# Patient Record
Sex: Female | Born: 1965
Health system: Southern US, Community
[De-identification: ages and names within clinical notes are randomized; demographics above are authoritative.]

## PROBLEM LIST (undated history)

## (undated) DIAGNOSIS — T7840XA Allergy, unspecified, initial encounter: Secondary | ICD-10-CM

## (undated) HISTORY — PX: ABDOMINAL HYSTERECTOMY: SHX81

## (undated) HISTORY — DX: Allergy, unspecified, initial encounter: T78.40XA

---

## 2000-05-01 ENCOUNTER — Other Ambulatory Visit: Admission: RE | Admit: 2000-05-01 | Discharge: 2000-05-01 | Payer: Self-pay | Admitting: *Deleted

## 2004-04-07 ENCOUNTER — Other Ambulatory Visit: Admission: RE | Admit: 2004-04-07 | Discharge: 2004-04-07 | Payer: Self-pay | Admitting: Obstetrics and Gynecology

## 2007-01-09 ENCOUNTER — Ambulatory Visit (HOSPITAL_COMMUNITY): Admission: RE | Admit: 2007-01-09 | Discharge: 2007-01-09 | Payer: Self-pay | Admitting: Obstetrics and Gynecology

## 2007-01-10 ENCOUNTER — Ambulatory Visit: Admission: RE | Admit: 2007-01-10 | Discharge: 2007-01-10 | Payer: Self-pay | Admitting: Gynecology

## 2007-02-04 ENCOUNTER — Inpatient Hospital Stay (HOSPITAL_COMMUNITY): Admission: RE | Admit: 2007-02-04 | Discharge: 2007-02-06 | Payer: Self-pay | Admitting: Obstetrics and Gynecology

## 2007-02-04 ENCOUNTER — Encounter: Payer: Self-pay | Admitting: Gynecologic Oncology

## 2008-08-04 ENCOUNTER — Encounter: Payer: Self-pay | Admitting: Orthopedic Surgery

## 2008-08-04 ENCOUNTER — Ambulatory Visit (HOSPITAL_COMMUNITY): Admission: RE | Admit: 2008-08-04 | Discharge: 2008-08-04 | Payer: Self-pay | Admitting: Pediatrics

## 2008-09-13 ENCOUNTER — Ambulatory Visit: Payer: Self-pay | Admitting: Orthopedic Surgery

## 2008-09-13 DIAGNOSIS — M24873 Other specific joint derangements of unspecified ankle, not elsewhere classified: Secondary | ICD-10-CM

## 2008-09-13 DIAGNOSIS — M24876 Other specific joint derangements of unspecified foot, not elsewhere classified: Secondary | ICD-10-CM

## 2008-09-14 ENCOUNTER — Encounter: Payer: Self-pay | Admitting: Orthopedic Surgery

## 2008-09-16 ENCOUNTER — Encounter (HOSPITAL_COMMUNITY): Admission: RE | Admit: 2008-09-16 | Discharge: 2008-09-21 | Payer: Self-pay | Admitting: Orthopedic Surgery

## 2008-09-16 ENCOUNTER — Encounter: Payer: Self-pay | Admitting: Orthopedic Surgery

## 2008-11-03 ENCOUNTER — Encounter: Admission: RE | Admit: 2008-11-03 | Discharge: 2008-11-03 | Payer: Self-pay | Admitting: Obstetrics and Gynecology

## 2010-08-15 NOTE — Consult Note (Signed)
[name], [name]             ACCOUNT NO.:  [id number]   MEDICAL RECORD NO.:  [id number]          PATIENT TYPE:  OUT   LOCATION:  GYN                          FACILITY:  [facility]   PHYSICIAN:  De Blanch, M.D.DATE OF BIRTH:  11/16/1965   DATE OF CONSULTATION:  DATE OF DISCHARGE:                                 CONSULTATION   CHIEF COMPLAINT:  Complex pelvic mass.   HISTORY OF PRESENT ILLNESS:  A 45 year old, white, married female  gravida 2, seen in consultation at the request of Dr. Carrington Mcbride  regarding management of a newly diagnosed left adnexal mass.  The  patient discovered the mass herself as she could feel it in her left  lower quadrant.  This has been further evaluated with an ultrasound and  a CT scan of the pelvis.  The CT scan shows a large complex mass in the  left adnexa measuring 8.3 x 7.5 x 6.6-cm.  Mass contains at least one  and potentially a second small septation showing minimal thickening in  the wall.  There are also some tiny calcifications in the inferior wall.  There is a small amount of free fluid.  The remainder of the scan looks  normal with no evidence of ascites, lymphadenopathy or any other omental  or peritoneal thickening.  She does have a 2.8 x 2.3-cm hepatic  hemangioma and a 6-mm lesion in the left lung which is nonspecific.  She  also has some small uterine fibroids.  CA-125 value was also obtained  which was 220 units per ml.   The patient has no significant gynecologic history.  She does note she  has dysmenorrhea.  Menstrual periods are regular and of approximately 2  to 3 days' duration.  She is not using any hormone replacement or oral  contraceptives.   OBSTETRICAL HISTORY:  Gravida 2.  She has 58 year old child and a 58-  year-old child.   PAST MEDICAL HISTORY:   MEDICAL ILLNESSES:  None.   PAST SURGICAL HISTORY:  None.   DRUG ALLERGIES:  None.  The patient does have a milk/protein allergy  causing diarrhea.   CURRENT MEDICATIONS:  Imodium p.r.n., Excedrin p.r.n.   FAMILY HISTORY:  Negative for gynecologic, breast, or colon cancer.  She  has a grandmother with brain cancer.   SOCIAL HISTORY:  She is married, nonsmoker, and a housewife.  Her  husband is a Ship broker at [employer].   REVIEW OF SYSTEMS:  A 10-point comprehensive review of systems is  otherwise negative except for occasional diarrhea which she associates  with her milk allergy.   PHYSICAL EXAMINATION:  VITAL SIGNS:  Weight 125 pounds, blood pressure  110/70, pulse 80, respiratory rate 20.  GENERAL:  The patient is a healthy white female in no acute distress.  HEENT:  Negative.  NECK:  Supple without thyromegaly.  LYMPH:  There is no supraclavicular or inguinal adenopathy.  ABDOMEN:  Soft, nontender.  There is a palpable mass in the left lower  quadrant which is relatively nontender.  It measures approximately 6-cm  on palpation.  PELVIC:  EG/BUS, vagina, bladder, urethra are normal.  The cervix is  parous and normal.  Uterus is anterior, slightly irregular, and is  contiguous with a mass in the left adnexa measuring approximately 7-cm  in diameter.  There is a moderate amount of mobility.  There is no cul-  de-sac nodularity or thickening of fixation.   IMPRESSION:  A complex pelvic mass with an elevated CA-125.   Given the patient's constellation of historical findings and imaging  studies, I suspect she has an endometrioma.  On the other hand, ovarian  cancer cannot be fully excluded.   I had a lengthy discussion with the patient and her friend regarding  management options.  I would recommend that the patient undergo  exploratory laparotomy through a Pfannenstiel incision with a left  salpingo-oophorectomy, evaluated by frozen section.  I indicated that if  this is a benign condition, that the patient would need to decide  preoperatively whether she wanted to have a  hysterectomy and/or a right  oophorectomy.  She understands that she would be menopausal if she had  the other ovary removed.  On the other hand, she understands that  leaving the ovary intact would preserve ovarian function for  approximately another 10 years.  The risks of ovarian cancer were  discussed, although they are not excessively high in this patient who  has no family history.   The extent of surgery if this turns out to be a cancer was discussed  including TAH-BSO, omentectomy, pelvic and periaortic lymphadenectomy  and surgical staging and/or debulking.  She understands that a second  midline incision may be necessary to accomplish all of these staging  procedures.   The risks of surgery were discussed.  We will coordinate surgery with  Dr. Henderson Cloud and anticipate November 4th as our next available operating  day.      De Blanch, M.D.  Electronically Signed     DC/MEDQ  D:  01/10/2007  T:  01/10/2007  Job:  [id number]   cc:   [name], M.D.  Fax: [phone]   Telford Nab, R.N.  [address]  [place], [place] [address]

## 2010-08-15 NOTE — Op Note (Signed)
NAMENUBIA, ZIESMER             ACCOUNT NO.:  [id number]   MEDICAL RECORD NO.:  [id number]          PATIENT TYPE:  INP   LOCATION:  1524                         FACILITY:  [facility]   PHYSICIAN:  Paola A. Duard Brady, MD    DATE OF BIRTH:  11-Aug-1965   DATE OF PROCEDURE:  02/04/2007  DATE OF DISCHARGE:                               OPERATIVE REPORT   PREOPERATIVE DIAGNOSIS:  Left-sided adnexal mass, elevated CA-125 of  220.   POSTOPERATIVE DIAGNOSIS:  Endometriosis  with left-sided endometrioma.   PROCEDURE:  Total abdominal hysterectomy and bilateral salpingo-  oophorectomy.   SURGEON:  Cleda Mccreedy, M.D. and Carrington Clamp, M.D.   ASSISTANT:  Telford Nab, R.N.   ANESTHESIA:  General.   ANESTHESIOLOGIST:  Dr. Coralyn Mark.   ESTIMATED BLOOD LOSS:  Less than 100 ml.   IV FLUIDS:  2000 ml.   URINE OUTPUT:  100 ml.   SPECIMENS:  Cervix, uterus, bilateral tubes and ovaries.   COMPLICATIONS:  None.   OPERATIVE FINDINGS:  Included an 8 cm left side endometrioma.  She had  endometriotic implants on the anterior vesicovaginal peritoneum, the  posterior cul-de-sac and the contralateral right ovary.  Frozen section  was consistent with a benign endometrioma.   The patient was taken to the operating room after consent was reviewed  and signed on the chart.  The patient was identified as self.  General  anesthesia was induced.  She was then placed in a dorsal lithotomy  position with all appropriate precautions.  The abdomen was then prepped  in the usual fashion.  The perineum was prepped in the usual sterile  fashion as was the vagina.  Foley catheter was inserted into the bladder  under sterile conditions.  The patient was then draped.  Time-out was  performed to confirm the surgeon, the patient, allergies and procedures.  The transverse  Pfannenstiel skin incision was made with a knife and  carried down to the underlying fascia with Bovie cautery.  The fascia  was identified  and was incised.  The fascial incisions were extended  laterally.  Peritoneum was identified, tented and entered sharply.  The  peritoneal incision was extended superiorly and inferiorly with  visualization of the underlying  peritoneal cavity.  The Bookwalter self-  retaining retractor was then placed on the bed.  All precautions were  taken at this time to ensure that there was no significant pressure on  the rectus bellies or the psoas muscles.  The patient was then placed in  a Trendelenburg position.  The small bowel was packed out of the way  using moist laparotomy sponges.  Washings were obtained.   Our attention was first drawn to the left pelvic sidewall.  The round  ligament was transected with monopolar cautery.  The anterior  and  posterior leaves of the broad ligament were then opened.  The ureter was  identified on the patient's left side.  A window was made between the IP  and the left ureter.  The IP was clamped x2, transected and suture  ligated with 0-Vicryl.  We then came across the  utero-ovarian with a  clamp.  Specimen was removed and sent to frozen section.  The uterine  artery was skeletonized and the bladder flap was created on the  patient's left side.  Uterine vessels were clamped x2 with curved  Masterson and transected and suture ligated.  Our attention was then  drawn to the patient's right side.  A similar procedure was performed in  that the round ligament was transected with monopolar cautery.  The  anterior posterior leaves of the broad ligament were then opened.  The  ureter was identified on the right side.  A window was created between  the ureter and the IP.  The IP was clamped x2, transected, suture  ligated.  Uterine artery was then skeletonized on the right side.  Curved Masterson's were then used to clamp the uterine artery.  It was  transected and suture ligated.  The bladder flap was then brought to the  level below the cervicovaginal junction  and we continued along the  cardinal ligament with curved Masterson's.  Each pedicle was created  with the [facility] and transected, suture ligated with 0-Vicryl.  We  came across the cervicovaginal junction with curved Masterson and the  specimen was amputated.  The vaginal angles were suture ligated with  figure-of-eight 0-Vicryl.  The vagina was closed with figure-of-eight 0-  Vicryl.  The abdomen and pelvis were copiously irrigated.  All pedicles  were noted to be hemostatic.  Frozen section at this point returned as  benign mass.  The retractors were then removed.  The moist laparotomy  sponges were removed.  The appendix was identified and was noted to be  normal in appearance.  The peritoneum was then closed with a running 2-0  Vicryl.  The fascia was closed with a running 0-Vicryl.  The  subcutaneous tissues were irrigated.  Skin was closed using skin clips.  A sterile dressing was applied.   The patient tolerated the procedure well and was taken to the recovery  room in stable condition.  All instrument, needle, laps and Ray-Tec  counts were correct x2.      Paola A. Duard Brady, MD  Electronically Signed     PAG/MEDQ  D:  02/04/2007  T:  02/05/2007  Job:  [id number]   cc:   Carrington Clamp, M.D.  Fax: [phone]   Telford Nab, R.N.  [address]  [place], [place] [address]

## 2010-08-18 NOTE — Discharge Summary (Signed)
[name], [name]             ACCOUNT NO.:  [id number]   MEDICAL RECORD NO.:  [id number]          PATIENT TYPE:  INP   LOCATION:  1524                         FACILITY:  [facility]   PHYSICIAN:  [name], M.D. DATE OF BIRTH:  Oct 04, 1965   DATE OF ADMISSION:  02/04/2007  DATE OF DISCHARGE:  02/06/2007                               DISCHARGE SUMMARY   ADMITTING DIAGNOSIS:  Complex pelvic mass.   DISCHARGE DIAGNOSIS:  Endometriosis.   PERTINENT PROCEDURES PERFORMED:  Total abdominal hysterectomy with  bilateral salpingo-oophorectomy.   PERTINENT LAB RESULTS:  Postop H and H of 9 and 27.   HISTORY AND PHYSICAL:  Please refer to dictated history and physical  from Dr. Stanford Mcbride on chart. Briefly, this is a 45 year old gravida  2, para 2, who was admitted for a complex left lower quadrant mass.   HOSPITAL COURSE:  The patient underwent the above-named procedures on  02/04/2007, without complication. It was clear from the operation that  there was an 8 cm left endometrioma with implants in the uterus and the  cul-de-sac. The final pathology confirmed that there was no cancer. The  patient underwent the procedures without complication and was returned  to the recovery room in stable condition. The rest of the hospital  course was uncomplicated and on postoperative day number two, the  patient is eating, ambulating and voiding and was discharged the  following day.  Medications were Percocet one p.o. q.4 to 6 hours p.r.n.  pain, Colace over-the-counter, Phenergan 2.5 mg q.4-6 hours p.r.n. pain.  The patient was to return on 02/07/2007, for staples out.  Activity was  may shower, but no bath. No lifting for six weeks. No driving for two  weeks. No sexual activity for six weeks. Diet was increased water and  fiber.      [name], M.D.  Electronically Signed     MH/MEDQ  D:  04/16/2007  T:  04/16/2007  Job:  [id number]

## 2011-01-09 LAB — BASIC METABOLIC PANEL
BUN: 3 — ABNORMAL LOW
Creatinine, Ser: 0.7
Glucose, Bld: 158 — ABNORMAL HIGH
Potassium: 4.6
Sodium: 139

## 2011-01-09 LAB — CBC
HCT: 27.3 — ABNORMAL LOW
MCHC: 33.1
MCV: 83.4
RBC: 3.27 — ABNORMAL LOW
RDW: 14.7 — ABNORMAL HIGH
WBC: 11.6 — ABNORMAL HIGH

## 2011-01-10 LAB — COMPREHENSIVE METABOLIC PANEL
Alkaline Phosphatase: 55
GFR calc non Af Amer: >60
Potassium: 4.1
Total Bilirubin: 0.5
Total Protein: 6.6

## 2011-01-10 LAB — DIFFERENTIAL
Basophils Absolute: 0
Eosinophils Relative: 1
Lymphs Abs: 1.1
Monocytes Absolute: 0.4
Monocytes Relative: 8
Neutro Abs: 3.4
Neutrophils Relative %: 68

## 2011-01-10 LAB — CBC
HCT: 34.4 — ABNORMAL LOW
MCHC: 33.2
Platelets: 238
RDW: 15 — ABNORMAL HIGH
WBC: 5

## 2011-01-10 LAB — TYPE AND SCREEN: Antibody Screen: NEGATIVE

## 2011-07-17 ENCOUNTER — Ambulatory Visit (HOSPITAL_COMMUNITY)
Admission: RE | Admit: 2011-07-17 | Discharge: 2011-07-17 | Disposition: A | Discharge status: Home / Self Care | Payer: BC Managed Care – PPO | Source: Ambulatory Visit | Attending: Pediatrics | Admitting: Pediatrics

## 2011-07-17 ENCOUNTER — Other Ambulatory Visit (HOSPITAL_COMMUNITY): Payer: Self-pay | Admitting: Pediatrics

## 2011-07-17 DIAGNOSIS — R109 Unspecified abdominal pain: Secondary | ICD-10-CM

## 2011-07-17 DIAGNOSIS — R3129 Other microscopic hematuria: Secondary | ICD-10-CM | POA: Insufficient documentation

## 2012-01-31 ENCOUNTER — Other Ambulatory Visit: Payer: Self-pay | Admitting: Obstetrics and Gynecology

## 2012-12-19 ENCOUNTER — Encounter: Payer: Self-pay | Admitting: Family Medicine

## 2012-12-19 ENCOUNTER — Ambulatory Visit (INDEPENDENT_AMBULATORY_CARE_PROVIDER_SITE_OTHER): Payer: 59 | Admitting: Family Medicine

## 2012-12-19 VITALS — BP 110/80 | HR 82 | Temp 97.4°F | Resp 20 | Ht 64.0 in | Wt 152.0 lb

## 2012-12-19 DIAGNOSIS — M25519 Pain in unspecified shoulder: Secondary | ICD-10-CM

## 2012-12-19 DIAGNOSIS — IMO0002 Reserved for concepts with insufficient information to code with codable children: Secondary | ICD-10-CM

## 2012-12-19 DIAGNOSIS — M792 Neuralgia and neuritis, unspecified: Secondary | ICD-10-CM

## 2012-12-19 DIAGNOSIS — M25512 Pain in left shoulder: Secondary | ICD-10-CM

## 2012-12-19 MED ORDER — GABAPENTIN 300 MG PO CAPS: 300.0000 mg | ORAL_CAPSULE | Freq: Every day | ORAL | Status: DC

## 2012-12-19 NOTE — Progress Notes (Signed)
  Subjective:    Patient ID: [name], female    DOB: 1965-10-24, 47 y.o.   MRN: [id number]  HPI Pt here to establish care, previous PCP - [facility]- [facility] Medications and History reviewed Left shoulder pain x 3 months, No specific injury, feels weak on that side compared to right, Also has episodes of tingling and numbness, feels like her entire arm and hand go to sleep, worse at night. Has taken multiple OTC medications with no relief Work in Production manager, also in school at [employer]- for [employer] on regular basis 2 children 1- in college   Review of Systems   GEN- denies fatigue, fever, weight loss,weakness, recent illness HEENT- denies eye drainage, change in vision, nasal discharge, CVS- denies chest pain, palpitations RESP- denies SOB, cough, wheeze ABD- denies N/V, change in stools, abd pain GU- denies dysuria, hematuria, dribbling, incontinence MSK- + joint pain, muscle aches, injury Neuro- denies headache, dizziness, syncope, seizure activity      Objective:   Physical Exam GEN- NAD, alert and oriented x3 HEENT- PERRL, EOMI, non injected sclera, pink conjunctiva, MMM, oropharynx clear Neck- Supple, no thryomegaly, FROM, neg spurlings CVS- RRR, no murmur RESP-CTAB MSK- Spine NT, +empty can left side, biceps in tact, decreased ROM left shoulder compared to right, no winged scapula Neuro-CNII-XII in tact, no focal deficits, normal tone UE, DTR symmetric, +phalens left side, neg tinels EXT- No edema Pulses- Radial, DP- 2+        Assessment & Plan:

## 2012-12-19 NOTE — Patient Instructions (Addendum)
Release of records- [facility] Release of records- [facility]- [place] Get the xrays done at [facility] we will call with results Start gabapentin at bedtime  Referral to orthopedics, Follow-up as needed

## 2012-12-20 DIAGNOSIS — M25512 Pain in left shoulder: Secondary | ICD-10-CM | POA: Insufficient documentation

## 2012-12-20 DIAGNOSIS — M792 Neuralgia and neuritis, unspecified: Secondary | ICD-10-CM | POA: Insufficient documentation

## 2012-12-20 NOTE — Assessment & Plan Note (Signed)
Concerning exam and some weakness, no specific injury Will obtain plain films of neck and shoulder Start gabapentin due to radicular symptoms, refer to orthopedics for further evaluation

## 2012-12-20 NOTE — Assessment & Plan Note (Signed)
Per above, gabapentin Differentials, cervical disease, pinched nerve

## 2012-12-22 ENCOUNTER — Ambulatory Visit (HOSPITAL_COMMUNITY)
Admission: RE | Admit: 2012-12-22 | Discharge: 2012-12-22 | Disposition: A | Discharge status: Home / Self Care | Payer: 59 | Source: Ambulatory Visit | Attending: Family Medicine | Admitting: Family Medicine

## 2012-12-22 DIAGNOSIS — M542 Cervicalgia: Secondary | ICD-10-CM | POA: Insufficient documentation

## 2012-12-22 DIAGNOSIS — M79609 Pain in unspecified limb: Secondary | ICD-10-CM | POA: Insufficient documentation

## 2012-12-22 DIAGNOSIS — M25512 Pain in left shoulder: Secondary | ICD-10-CM

## 2013-01-07 ENCOUNTER — Ambulatory Visit (INDEPENDENT_AMBULATORY_CARE_PROVIDER_SITE_OTHER): Payer: 59 | Admitting: Orthopedic Surgery

## 2013-01-07 VITALS — BP 116/74 | Ht 64.0 in | Wt 150.0 lb

## 2013-01-07 DIAGNOSIS — M5412 Radiculopathy, cervical region: Secondary | ICD-10-CM

## 2013-01-07 DIAGNOSIS — M67919 Unspecified disorder of synovium and tendon, unspecified shoulder: Secondary | ICD-10-CM

## 2013-01-07 DIAGNOSIS — M75102 Unspecified rotator cuff tear or rupture of left shoulder, not specified as traumatic: Secondary | ICD-10-CM

## 2013-01-07 MED ORDER — DICLOFENAC POTASSIUM 50 MG PO TABS: 50.0000 mg | ORAL_TABLET | Freq: Two times a day (BID) | ORAL | Status: DC

## 2013-01-07 NOTE — Patient Instructions (Addendum)
Continue Gabapentin and start diclofenac 50 twice a day Do exercises  ROTATOR CUFF SYNDROME/IMPINGEMENT  Impingement syndrome is a condition that involves inflammation of the tendons of the rotator cuff and the subacromial bursa, that causes pain in the shoulder. The rotator cuff consists of four tendons and muscles that control much of the shoulder and upper arm function. The subacromial bursa is a fluid filled sac that helps reduce friction between the rotator cuff and one of the bones of the shoulder (acromion). Impingement syndrome is usually an overuse injury that causes swelling of the bursa (bursitis), swelling of the tendon (tendonitis), and/or a tear of the tendon (strain). Strains are classified into three categories. Grade 1 strains cause pain, but the tendon is not lengthened. Grade 2 strains include a lengthened ligament, due to the ligament being stretched or partially ruptured. With grade 2 strains there is still function, although the function may be decreased. Grade 3 strains include a complete tear of the tendon or muscle, and function is usually impaired. SYMPTOMS   Pain around the shoulder, often at the outer portion of the upper arm.  Pain that gets worse with shoulder function, especially when reaching overhead or lifting.  Sometimes, aching when not using the arm.  Pain that wakes you up at night.  Sometimes, tenderness, swelling, warmth, or redness over the affected area.  Loss of strength.  Limited motion of the shoulder, especially reaching behind the back (to the back pocket or to unhook bra) or across your body.  Crackling sound (crepitation) when moving the arm.  Biceps tendon pain and inflammation (in the front of the shoulder). Worse when bending the elbow or lifting. CAUSES  Impingement syndrome is often an overuse injury, in which chronic (repetitive) motions cause the tendons or bursa to become inflamed. A strain occurs when a force is paced on the tendon  or muscle that is greater than it can withstand. Common mechanisms of injury include: Stress from sudden increase in duration, frequency, or intensity of training.  Direct hit (trauma) to the shoulder.  Aging, erosion of the tendon with normal use.  Bony bump on shoulder (acromial spur). RISK INCREASES WITH:  Contact sports (football, wrestling, boxing).  Throwing sports (baseball, tennis, volleyball).  Weightlifting and bodybuilding.  Heavy labor.  Previous injury to the rotator cuff, including impingement.  Poor shoulder strength and flexibility.  Failure to warm up properly before activity.  Inadequate protective equipment.  Old age.  Bony bump on shoulder (acromial spur). PREVENTION   Warm up and stretch properly before activity.  Allow for adequate recovery between workouts.  Maintain physical fitness:  Strength, flexibility, and endurance.  Cardiovascular fitness.  Learn and use proper exercise technique. PROGNOSIS  If treated properly, impingement syndrome usually goes away within 6 weeks. Sometimes surgery is required.  RELATED COMPLICATIONS   Longer healing time if not properly treated, or if not given enough time to heal.  Recurring symptoms, that result in a chronic condition.  Shoulder stiffness, frozen shoulder, or loss of motion.  Rotator cuff tendon tear.  Recurring symptoms, especially if activity is resumed too soon, with overuse, with a direct blow, or when using poor technique. TREATMENT  Treatment first involves the use of ice and medicine, to reduce pain and inflammation. The use of strengthening and stretching exercises may help reduce pain with activity. These exercises may be performed at home or with a therapist. If non-surgical treatment is unsuccessful after more than 6 months, surgery may be advised. After surgery  and rehabilitation, activity is usually possible in 3 months.  MEDICATION  If pain medicine is needed, nonsteroidal  anti-inflammatory medicines (aspirin and ibuprofen), or other minor pain relievers (acetaminophen), are often advised.  Do not take pain medicine for 7 days before surgery.  Prescription pain relievers may be given, if your caregiver thinks they are needed. Use only as directed and only as much as you need.  Corticosteroid injections may be given by your caregiver. These injections should be reserved for the most serious cases, because they may only be given a certain number of times. HEAT AND COLD  Cold treatment (icing) should be applied for 10 to 15 minutes every 2 to 3 hours for inflammation and pain, and immediately after activity that aggravates your symptoms. Use ice packs or an ice massage.  Heat treatment may be used before performing stretching and strengthening activities prescribed by your caregiver, physical therapist, or athletic trainer. Use a heat pack or a warm water soak. SEEK MEDICAL CARE IF:   Symptoms get worse or do not improve in 4 to 6 weeks, despite treatment.  New, unexplained symptoms develop. (Drugs used in treatment may produce side effects.)

## 2013-01-10 ENCOUNTER — Encounter: Payer: Self-pay | Admitting: Orthopedic Surgery

## 2013-01-10 DIAGNOSIS — M75102 Unspecified rotator cuff tear or rupture of left shoulder, not specified as traumatic: Secondary | ICD-10-CM | POA: Insufficient documentation

## 2013-01-10 DIAGNOSIS — M5412 Radiculopathy, cervical region: Secondary | ICD-10-CM | POA: Insufficient documentation

## 2013-01-10 NOTE — Progress Notes (Addendum)
Patient ID: [name], female   DOB: [birth year], 47 y.o.   MRN: [id number]  Chief Complaint  Patient presents with  . Shoulder Pain    Left shoulder pain, no injury. Consult from Dr. Jeanice Lim.    HISTORY: Left shoulder pain  Started late June  Gradual No injury  No treatment  Dull, throbbing 4/10 constatnt Numbness and tingling    The past, family history and social history have been reviewed and are recorded in the corresponding sections of epic   Review of systems Review of Systems  Constitutional:       Weight gain   Endo/Heme/Allergies:       Food allergy: milk protein      Blood pressure 116/74, height 5\' 4"  (1.626 m), weight 150 lb (68.04 kg). Cardiovascular exam radial and ulnar pulses are normal bilaterally no swelling or edema in the upper extremities Lymph exam shows no lymph nodes palpable in the supraclavicular or axillary regions of the right or left upper extremity General appearance is normal, the patient is alert and oriented x3 with normal mood and affect. Gait and station are normal Neurologic exam shows normal reflexes bilaterally 2+ despite the symptoms of burning and radiating numb-like symptoms in the left arm  The cervical spine has full range of motion there is some tenderness in the trapezius muscle and centrally over the C4-5 junction  The right shoulder shows full range of motion normal strength and stability with no tenderness or swelling  Left shoulder shows decreased range of motion especially internal rotation with mild impingement signs but rotator cuff intact. No instability is noted with abduction external rotation or inferior subluxation test. In the abductor position with internal rotation the shoulder is stable posteriorly tenderness is not a significant finding in the left shoulder  Impression Encounter Diagnoses  Name Primary?  . Rotator cuff syndrome of left shoulder Yes  . Cervical radicular pain    She declined injection  and wanted to try medication so we started her on diclofenac for her rotator cuff syndrome and gabapentin for her cervical radicular symptoms

## 2013-01-12 ENCOUNTER — Encounter: Payer: Self-pay | Admitting: Family Medicine

## 2013-02-05 ENCOUNTER — Other Ambulatory Visit: Payer: Self-pay

## 2013-03-04 ENCOUNTER — Ambulatory Visit: Payer: 59 | Admitting: Orthopedic Surgery

## 2013-04-11 ENCOUNTER — Encounter: Payer: Self-pay | Admitting: Family Medicine

## 2013-11-17 ENCOUNTER — Encounter: Payer: Self-pay | Admitting: *Deleted

## 2013-11-17 ENCOUNTER — Encounter: Payer: Self-pay | Admitting: Family Medicine

## 2013-11-17 ENCOUNTER — Ambulatory Visit (INDEPENDENT_AMBULATORY_CARE_PROVIDER_SITE_OTHER): Payer: 59 | Admitting: Family Medicine

## 2013-11-17 VITALS — BP 122/68 | HR 72 | Temp 98.4°F | Resp 14 | Ht 64.0 in | Wt 142.0 lb

## 2013-11-17 DIAGNOSIS — R197 Diarrhea, unspecified: Secondary | ICD-10-CM

## 2013-11-17 DIAGNOSIS — R109 Unspecified abdominal pain: Secondary | ICD-10-CM

## 2013-11-17 DIAGNOSIS — R1013 Epigastric pain: Secondary | ICD-10-CM

## 2013-11-17 LAB — URINALYSIS, MICROSCOPIC ONLY
Casts: NONE SEEN
Crystals: NONE SEEN
Squamous Epithelial / LPF: NONE SEEN

## 2013-11-17 LAB — URINALYSIS, ROUTINE W REFLEX MICROSCOPIC
BILIRUBIN URINE: NEGATIVE
GLUCOSE, UA: NEGATIVE mg/dL
KETONES UR: NEGATIVE mg/dL
NITRITE: NEGATIVE
PH: 7.5 (ref 5.0–8.0)
Protein, ur: NEGATIVE mg/dL
SPECIFIC GRAVITY, URINE: 1.015 (ref 1.005–1.030)
Urobilinogen, UA: 0.2 mg/dL (ref 0.0–1.0)

## 2013-11-17 MED ORDER — ONDANSETRON 4 MG PO TBDP: 4.0000 mg | ORAL_TABLET | Freq: Three times a day (TID) | ORAL | Status: DC | PRN

## 2013-11-17 NOTE — Progress Notes (Signed)
Patient ID: [name], female   DOB: 06/05/1965, 48 y.o.   MRN: [id number]   Subjective:    Patient ID: [name], female    DOB: 09/26/1965, 48 y.o.   MRN: [id number]  Patient presents for Pain  Patient here with left-sided flank pain , right upper quadrant pain nausea no emesis diarrhea ( 4-5 times a day) for the past 5 days. She states that the pain at night to worsen so yesterday. She's not having dysuria or blood in her urine. She's not had any blood in the stools. She's not had any fever. She does have her gallbladder. She eats very healthy and clean therefore has not noticed any abdominal epigastric pain previous to today.   Review Of Systems:  GEN- denies fatigue, fever, weight loss,weakness, recent illness HEENT- denies eye drainage, change in vision, nasal discharge, CVS- denies chest pain, palpitations RESP- denies SOB, cough, wheeze ABD- + N/V, +change in stools, abd pain GU- denies dysuria, hematuria, dribbling, incontinence Neuro- denies headache, dizziness, syncope, seizure activity       Objective:    BP 122/68  Pulse 72  Temp(Src) 98.4 F (36.9 C) (Oral)  Resp 14  Ht 5\' 4"  (1.626 m)  Wt 142 lb (64.411 kg)  BMI 24.36 kg/m2 GEN- NAD, alert and oriented x3 HEENT- PERRL, EOMI, non injected sclera, pink conjunctiva, MMM, oropharynx clear Neck- Supple, no LAD CVS- RRR, no murmur RESP-CTAB ABD-NABS,soft,TTP epigastric and RUQ, no HSM, equvical murphy sign, neg CVA tenderness,ND EXT- No edema Pulses- Radial 2+        Assessment & Plan:      Problem List Items Addressed This Visit   None    Visit Diagnoses   Diarrhea    -  Primary    Possible gallbladder etiology vs viral illness, will have her take immodium, check labs, zofran for nausea, no sign of dehydration, fairly bening abdomen    Relevant Orders       CBC with Differential       Comprehensive metabolic panel    Abdominal pain, epigastric        Dx- viral gastroenteritis, vs  gallbladder, vs gastris, possible kidney stone, check labsUA,UC    Relevant Orders       CBC with Differential       Comprehensive metabolic panel    Flank pain        Relevant Orders       Urinalysis, Routine w reflex microscopic (Completed)       Urine culture       Note: This dictation was prepared with Dragon dictation along with smaller phrase technology. Any transcriptional errors that result from this process are unintentional.

## 2013-11-17 NOTE — Patient Instructions (Signed)
Take immodium and zofran Push fluids I will call with results We may need ultrasound F/u as needed

## 2013-11-18 LAB — CBC WITH DIFFERENTIAL/PLATELET
BASOS ABS: 0 10*3/uL (ref 0.0–0.1)
BASOS PCT: 0 % (ref 0–1)
EOS ABS: 0.2 10*3/uL (ref 0.0–0.7)
EOS PCT: 3 % (ref 0–5)
HCT: 38.6 % (ref 36.0–46.0)
Hemoglobin: 13.4 g/dL (ref 12.0–15.0)
LYMPHS ABS: 1.6 10*3/uL (ref 0.7–4.0)
Lymphocytes Relative: 25 % (ref 12–46)
MCH: 30.9 pg (ref 26.0–34.0)
MCHC: 34.7 g/dL (ref 30.0–36.0)
MCV: 88.9 fL (ref 78.0–100.0)
MONO ABS: 0.4 10*3/uL (ref 0.1–1.0)
MONOS PCT: 6 % (ref 3–12)
NEUTROS ABS: 4.2 10*3/uL (ref 1.7–7.7)
Neutrophils Relative %: 66 % (ref 43–77)
Platelets: 246 10*3/uL (ref 150–400)
RBC: 4.34 MIL/uL (ref 3.87–5.11)
RDW: 12.7 % (ref 11.5–15.5)
WBC: 6.3 10*3/uL (ref 4.0–10.5)

## 2013-11-18 LAB — COMPREHENSIVE METABOLIC PANEL
ALBUMIN: 4.6 g/dL (ref 3.5–5.2)
ALT: 17 U/L (ref 0–35)
AST: 20 U/L (ref 0–37)
Alkaline Phosphatase: 55 U/L (ref 39–117)
BUN: 10 mg/dL (ref 6–23)
CO2: 27 meq/L (ref 19–32)
Calcium: 9.3 mg/dL (ref 8.4–10.5)
Chloride: 102 mEq/L (ref 96–112)
Creat: 0.74 mg/dL (ref 0.50–1.10)
Glucose, Bld: 81 mg/dL (ref 70–99)
POTASSIUM: 4 meq/L (ref 3.5–5.3)
SODIUM: 139 meq/L (ref 135–145)
Total Bilirubin: 0.5 mg/dL (ref 0.2–1.2)
Total Protein: 6.8 g/dL (ref 6.0–8.3)

## 2013-11-22 LAB — URINE CULTURE

## 2013-11-26 ENCOUNTER — Other Ambulatory Visit: Payer: Self-pay | Admitting: *Deleted

## 2013-11-26 ENCOUNTER — Encounter: Payer: Self-pay | Admitting: *Deleted

## 2013-11-26 MED ORDER — AMOXICILLIN-POT CLAVULANATE 875-125 MG PO TABS: 1.0000 | ORAL_TABLET | Freq: Two times a day (BID) | ORAL | Status: DC

## 2013-11-26 NOTE — Telephone Encounter (Signed)
Requested to have medication sent to [facility] to be delivered to [facility].   Prescription sent to pharmacy.

## 2014-02-02 ENCOUNTER — Encounter: Payer: Self-pay | Admitting: Family Medicine

## 2014-02-02 ENCOUNTER — Ambulatory Visit (INDEPENDENT_AMBULATORY_CARE_PROVIDER_SITE_OTHER): Payer: 59 | Admitting: Family Medicine

## 2014-02-02 VITALS — BP 128/76 | HR 68 | Temp 97.7°F | Resp 14 | Wt 145.0 lb

## 2014-02-02 DIAGNOSIS — J0101 Acute recurrent maxillary sinusitis: Secondary | ICD-10-CM

## 2014-02-02 NOTE — Progress Notes (Signed)
Patient ID: [name], female   DOB: 09/08/1965, 48 y.o.   MRN: [id number]   Subjective:    Patient ID: Carrie Slotmanda K Otter, female    DOB: 11/18/1965, 48 y.o.   MRN: [id number]  Patient presents for No chief complaint on file. patient here with sinus pressure and drainage and low-grade fever for the past 2 weeks. Initially started as an URI and she improved however she worsened over the past week. She is taking over-the-counter NyQuil date will with minimal improvement in symptoms. She also had some soreness and pain between her shoulder blades yesterday with a cough that is mostly dry. 2 days ago she also broke a blood vessel in her left eye she denies any change in her vision    Review Of Systems:  GEN- denies fatigue,+ fever, weight loss,weakness, recent illness HEENT- denies eye drainage, change in vision, +nasal discharge, CVS- denies chest pain, palpitations RESP- denies SOB, +cough, wheeze ABD- denies N/V, change in stools, abd pain GU- denies dysuria, hematuria, dribbling, incontinence MSK- denies joint pain, muscle aches, injury Neuro- denies headache, dizziness, syncope, seizure activity       Objective:    There were no vitals taken for this visit. GEN- NAD, alert and oriented x3 HEENT- PERRL, EOMI, non injected sclera, pink conjunctiva, MMM, oropharynx mild injection, TM clear bilat no effusion, small lower left subconjunvital hemorrhage + maxillary sinus tenderness, inflammed turbinates,  Nasal drainage  Neck- Supple, no LAD CVS- RRR, no murmur RESP-CTAB EXT- No edema Pulses- Radial 2+          Assessment & Plan:      Problem List Items Addressed This Visit    None    Visit Diagnoses    Acute recurrent maxillary sinusitis    -  Primary    Augmentin, nasal saline, mucinex DM       Note: This dictation was prepared with Dragon dictation along with smaller phrase technology. Any transcriptional errors that result from this process are unintentional.

## 2014-03-08 ENCOUNTER — Ambulatory Visit (INDEPENDENT_AMBULATORY_CARE_PROVIDER_SITE_OTHER): Payer: 59 | Admitting: Family Medicine

## 2014-03-08 ENCOUNTER — Encounter: Payer: Self-pay | Admitting: Family Medicine

## 2014-03-08 VITALS — BP 108/62 | HR 72 | Temp 98.7°F | Resp 16 | Ht 64.0 in | Wt 144.0 lb

## 2014-03-08 DIAGNOSIS — J0101 Acute recurrent maxillary sinusitis: Secondary | ICD-10-CM

## 2014-03-08 DIAGNOSIS — J029 Acute pharyngitis, unspecified: Secondary | ICD-10-CM

## 2014-03-08 DIAGNOSIS — L710 Perioral dermatitis: Secondary | ICD-10-CM

## 2014-03-08 LAB — RAPID STREP SCREEN (MED CTR MEBANE ONLY): STREPTOCOCCUS, GROUP A SCREEN (DIRECT): POSITIVE — AB

## 2014-03-08 MED ORDER — AMOXICILLIN-POT CLAVULANATE 875-125 MG PO TABS: 1.0000 | ORAL_TABLET | Freq: Two times a day (BID) | ORAL | Status: DC

## 2014-03-08 MED ORDER — PREDNISONE 20 MG PO TABS: 20.0000 mg | ORAL_TABLET | Freq: Every day | ORAL | Status: DC

## 2014-03-08 NOTE — Patient Instructions (Addendum)
Take antibiotics as prescribed Take prednisone once a day as prescribed Continue mucinex Use chloroseptic spray Use a flonase  WORK NOTE  F/u AS NEEDED

## 2014-03-08 NOTE — Progress Notes (Signed)
Patient ID: Ritta Slotmanda K Parkey, female   DOB: 01/21/1966, 48 y.o.   MRN: [id number]   Subjective:    Patient ID: Ritta Slotmanda K Ziller, female    DOB: 05/03/1965, 48 y.o.   MRN: [id number]  Patient presents for Illness  patient here with sore throat and sinus congestion mild cough. She was treated about a month ago for sinusitis. She feels like she never truly recovered. She did complete a course of Augmentin. She has not had any fever. The past couple days she noticed some red bumps pop up around her mouth and her lips feel a little tight she's not had any new medications in the past couple weeks. She denies any difficulty breathing.    Review Of Systems: per above  GEN- + fatigue, fever, weight loss,weakness, recent illness HEENT- denies eye drainage, change in vision,+ nasal discharge, CVS- denies chest pain, palpitations RESP- denies SOB, +cough, wheeze ABD- denies N/V, change in stools, abd pain GU- denies dysuria, hematuria, dribbling, incontinence MSK- denies joint pain, muscle aches, injury Neuro- denies headache, dizziness, syncope, seizure activity       Objective:    BP 108/62 mmHg  Pulse 72  Temp(Src) 98.7 F (37.1 C) (Oral)  Resp 16  Ht 5\' 4"  (1.626 m)  Wt 144 lb (65.318 kg)  BMI 24.71 kg/m2 GEN- NAD, alert and oriented x3 HEENT- PERRL, EOMI, non injected sclera, pink conjunctiva, MMM, oropharynx + injection, TM clear bilat no effusion,  + maxillary sinus tenderness, inflammed turbinates,  Nasal drainage  Neck- Supple, + LAD CVS- RRR, no murmur RESP-CTAB Skin- 2 small tiny pustular lesions around upper and lower lip, no blistering lesions, no mucusal lesions EXT- No edema Pulses- Radial 2+          Assessment & Plan:      Problem List Items Addressed This Visit    None    Visit Diagnoses    Acute pharyngitis, unspecified pharyngitis type    -  Primary    + strep, augmentin course again, OTC sore throat meds, will add prednisone with recurrent sinusitis low  dose,     Relevant Orders       Rapid Strep Screen (Completed)    Acute recurrent maxillary sinusitis        Relevant Medications       predniSONE (DELTASONE) tablet       amoxicillin-clavulanate (AUGMENTIN) 875-125 MG per tablet    Perioral dermatitis        possible dermatitis, vs early HSV I infection with decreased immune system, treat per above       Note: This dictation was prepared with Dragon dictation along with smaller phrase technology. Any transcriptional errors that result from this process are unintentional.

## 2014-04-16 ENCOUNTER — Encounter: Payer: Self-pay | Admitting: Family Medicine

## 2014-04-16 ENCOUNTER — Ambulatory Visit (INDEPENDENT_AMBULATORY_CARE_PROVIDER_SITE_OTHER): Payer: 59 | Admitting: Family Medicine

## 2014-04-16 VITALS — BP 106/70 | HR 64 | Temp 98.2°F | Resp 12 | Ht 64.0 in | Wt 143.0 lb

## 2014-04-16 DIAGNOSIS — J329 Chronic sinusitis, unspecified: Secondary | ICD-10-CM

## 2014-04-16 DIAGNOSIS — L710 Perioral dermatitis: Secondary | ICD-10-CM

## 2014-04-16 LAB — CBC W/MCH & 3 PART DIFF
HEMATOCRIT: 41.5 % (ref 36.0–46.0)
HEMOGLOBIN: 14.3 g/dL (ref 12.0–15.0)
Lymphocytes Relative: 32 % (ref 12–46)
Lymphs Abs: 1.5 10*3/uL (ref 0.7–4.0)
MCH: 32.1 pg (ref 26.0–34.0)
MCHC: 34.5 g/dL (ref 30.0–36.0)
MCV: 93 fL (ref 78.0–100.0)
Neutro Abs: 2.8 10*3/uL (ref 1.7–7.7)
Neutrophils Relative %: 60 % (ref 43–77)
PLATELETS: 241 10*3/uL (ref 150–400)
RBC: 4.46 MIL/uL (ref 3.87–5.11)
RDW: 13.3 % (ref 11.5–15.5)
WBC mixed population %: 8 % (ref 3–18)
WBC mixed population: 0.4 10*3/uL (ref 0.1–1.8)
WBC: 4.7 10*3/uL (ref 4.0–10.5)

## 2014-04-16 MED ORDER — PREDNISONE 10 MG PO TABS: ORAL_TABLET | ORAL | Status: DC

## 2014-04-16 MED ORDER — LEVOFLOXACIN 500 MG PO TABS: 500.0000 mg | ORAL_TABLET | Freq: Every day | ORAL | Status: DC

## 2014-04-16 NOTE — Progress Notes (Signed)
Patient ID: [name], female   DOB: 07/22/1965, 49 y.o.   MRN: [id number]   Subjective:    Patient ID: [name], female    DOB: 01/04/1966, 49 y.o.   MRN: [id number]  Patient presents for Illness  Patient here with recurrent sinus pressure pain headache as well as perioral tingling. She was seen back in November at that time diagnosed with acute sinusitis she took a round of Augmentin she returned back in December she had improved but then worsened and had sore throat along with the sinus pressure and drainage her strep was positive at that time therefore treated with Augmentin as well as some prednisone she states that she felt completely better and then a week or 2 after the medications were all all of her symptoms are back again. She finds the sinus pressure unbearable at times she also has pain into her gums she is using all of the nasal steroids as well as nasal saline rinses however is making her nose very irritated and sore she also continues to have tightness around her mouth and a tingling sensation and dry skin she's been using different topicals however they cause more irritation   Review Of Systems:  GEN- denies fatigue, fever, weight loss,weakness, recent illness HEENT- denies eye drainage, change in vision, nasal discharge, CVS- denies chest pain, palpitations RESP- denies SOB, cough, wheeze ABD- denies N/V, change in stools, abd pain GU- denies dysuria, hematuria, dribbling, incontinence MSK- denies joint pain, muscle aches, injury Neuro- denies headache, dizziness, syncope, seizure activity       Objective:    BP 106/70 mmHg  Pulse 64  Temp(Src) 98.2 F (36.8 C) (Oral)  Resp 12  Ht 5\' 4"  (1.626 m)  Wt 143 lb (64.864 kg)  BMI 24.53 kg/m2 GEN- NAD, alert and oriented x3 HEENT- PERRL, EOMI, non injected sclera, pink conjunctiva, MMM, oropharynx clear, TM clear bilat no effusion,  + maxillary sinus tenderness,, nares dry, mild erythema  Nasal drainage   Neck- Supple, no LAD [place]- perioral dryness, mild erythema above 1 spot on upper lip, flushed apperance to face            Assessment & Plan:      Problem List Items Addressed This Visit    None    Visit Diagnoses    Recurrent sinus infections    -  Primary    treat with levaquin and prednisone taper, she did clear symptoms but returned, hold on nasal irrigation as very irritated, if no improvement ENT referral    Relevant Medications    levofloxacin (LEVAQUIN) tablet    predniSONE (DELTASONE) tablet    Other Relevant Orders    CBC w/[facility] & 3 Part Diff (Completed)    Perioral dermatitis        Dryness noted, can try dab of cortisone 1% OTC and vaseline       Note: This dictation was prepared with Dragon dictation along with smaller phrase technology. Any transcriptional errors that result from this process are unintentional.

## 2014-04-16 NOTE — Patient Instructions (Addendum)
Use cortisone around mouth Prednisone taper Levaquin antibiotic Call if not better and ENT referral will be done

## 2014-10-06 ENCOUNTER — Encounter: Payer: Self-pay | Admitting: Family Medicine

## 2014-10-06 ENCOUNTER — Ambulatory Visit (INDEPENDENT_AMBULATORY_CARE_PROVIDER_SITE_OTHER): Payer: 59 | Admitting: Family Medicine

## 2014-10-06 VITALS — BP 126/70 | HR 68 | Temp 98.3°F | Resp 16 | Ht 64.0 in | Wt 143.0 lb

## 2014-10-06 DIAGNOSIS — R1011 Right upper quadrant pain: Secondary | ICD-10-CM | POA: Diagnosis not present

## 2014-10-06 DIAGNOSIS — R079 Chest pain, unspecified: Secondary | ICD-10-CM | POA: Diagnosis not present

## 2014-10-06 DIAGNOSIS — F418 Other specified anxiety disorders: Secondary | ICD-10-CM | POA: Diagnosis not present

## 2014-10-06 LAB — CBC WITH DIFFERENTIAL/PLATELET
BASOS ABS: 0 10*3/uL (ref 0.0–0.1)
BASOS PCT: 0 % (ref 0–1)
EOS PCT: 5 % (ref 0–5)
Eosinophils Absolute: 0.3 10*3/uL (ref 0.0–0.7)
HCT: 42.5 % (ref 36.0–46.0)
HEMOGLOBIN: 14.3 g/dL (ref 12.0–15.0)
Lymphocytes Relative: 25 % (ref 12–46)
Lymphs Abs: 1.7 10*3/uL (ref 0.7–4.0)
MCH: 31 pg (ref 26.0–34.0)
MCHC: 33.6 g/dL (ref 30.0–36.0)
MCV: 92 fL (ref 78.0–100.0)
MONO ABS: 0.5 10*3/uL (ref 0.1–1.0)
MPV: 11.5 fL (ref 8.6–12.4)
Monocytes Relative: 7 % (ref 3–12)
Neutro Abs: 4.3 10*3/uL (ref 1.7–7.7)
Neutrophils Relative %: 63 % (ref 43–77)
PLATELETS: 266 10*3/uL (ref 150–400)
RBC: 4.62 MIL/uL (ref 3.87–5.11)
RDW: 12.9 % (ref 11.5–15.5)
WBC: 6.8 10*3/uL (ref 4.0–10.5)

## 2014-10-06 LAB — LIPASE: LIPASE: 24 U/L (ref 0–75)

## 2014-10-06 LAB — COMPREHENSIVE METABOLIC PANEL
ALK PHOS: 73 U/L (ref 39–117)
ALT: 18 U/L (ref 0–35)
AST: 21 U/L (ref 0–37)
Albumin: 4.7 g/dL (ref 3.5–5.2)
BILIRUBIN TOTAL: 0.3 mg/dL (ref 0.2–1.2)
BUN: 12 mg/dL (ref 6–23)
CALCIUM: 9.5 mg/dL (ref 8.4–10.5)
CHLORIDE: 103 meq/L (ref 96–112)
CO2: 24 mEq/L (ref 19–32)
Creat: 0.92 mg/dL (ref 0.50–1.10)
Glucose, Bld: 98 mg/dL (ref 70–99)
Potassium: 4.7 mEq/L (ref 3.5–5.3)
Sodium: 143 mEq/L (ref 135–145)
Total Protein: 7.3 g/dL (ref 6.0–8.3)

## 2014-10-06 MED ORDER — ALPRAZOLAM 0.5 MG PO TABS: 0.5000 mg | ORAL_TABLET | Freq: Two times a day (BID) | ORAL | Status: AC | PRN

## 2014-10-06 NOTE — Patient Instructions (Signed)
Ultrasound to be set up We will call with lab results Try the nerve medication F/U as needed
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
## 2014-10-07 ENCOUNTER — Ambulatory Visit (HOSPITAL_COMMUNITY)
Admission: RE | Admit: 2014-10-07 | Discharge: 2014-10-07 | Disposition: A | Discharge status: Home / Self Care | Payer: 59 | Source: Ambulatory Visit | Attending: Family Medicine | Admitting: Family Medicine

## 2014-10-07 ENCOUNTER — Other Ambulatory Visit (HOSPITAL_COMMUNITY): Payer: 59

## 2014-10-07 DIAGNOSIS — R1011 Right upper quadrant pain: Secondary | ICD-10-CM | POA: Insufficient documentation

## 2014-10-08 ENCOUNTER — Ambulatory Visit: Payer: Self-pay | Admitting: Family Medicine

## 2015-09-02 IMAGING — CR DG SHOULDER 2+V*L*
3 series · 3 of 3 positions shown · non-contrast
Comparison: None

CLINICAL DATA: Left neck pain down left arm for 3 months

EXAM:
LEFT SHOULDER - 2+ VIEW

[view not recorded (1 of 3)]
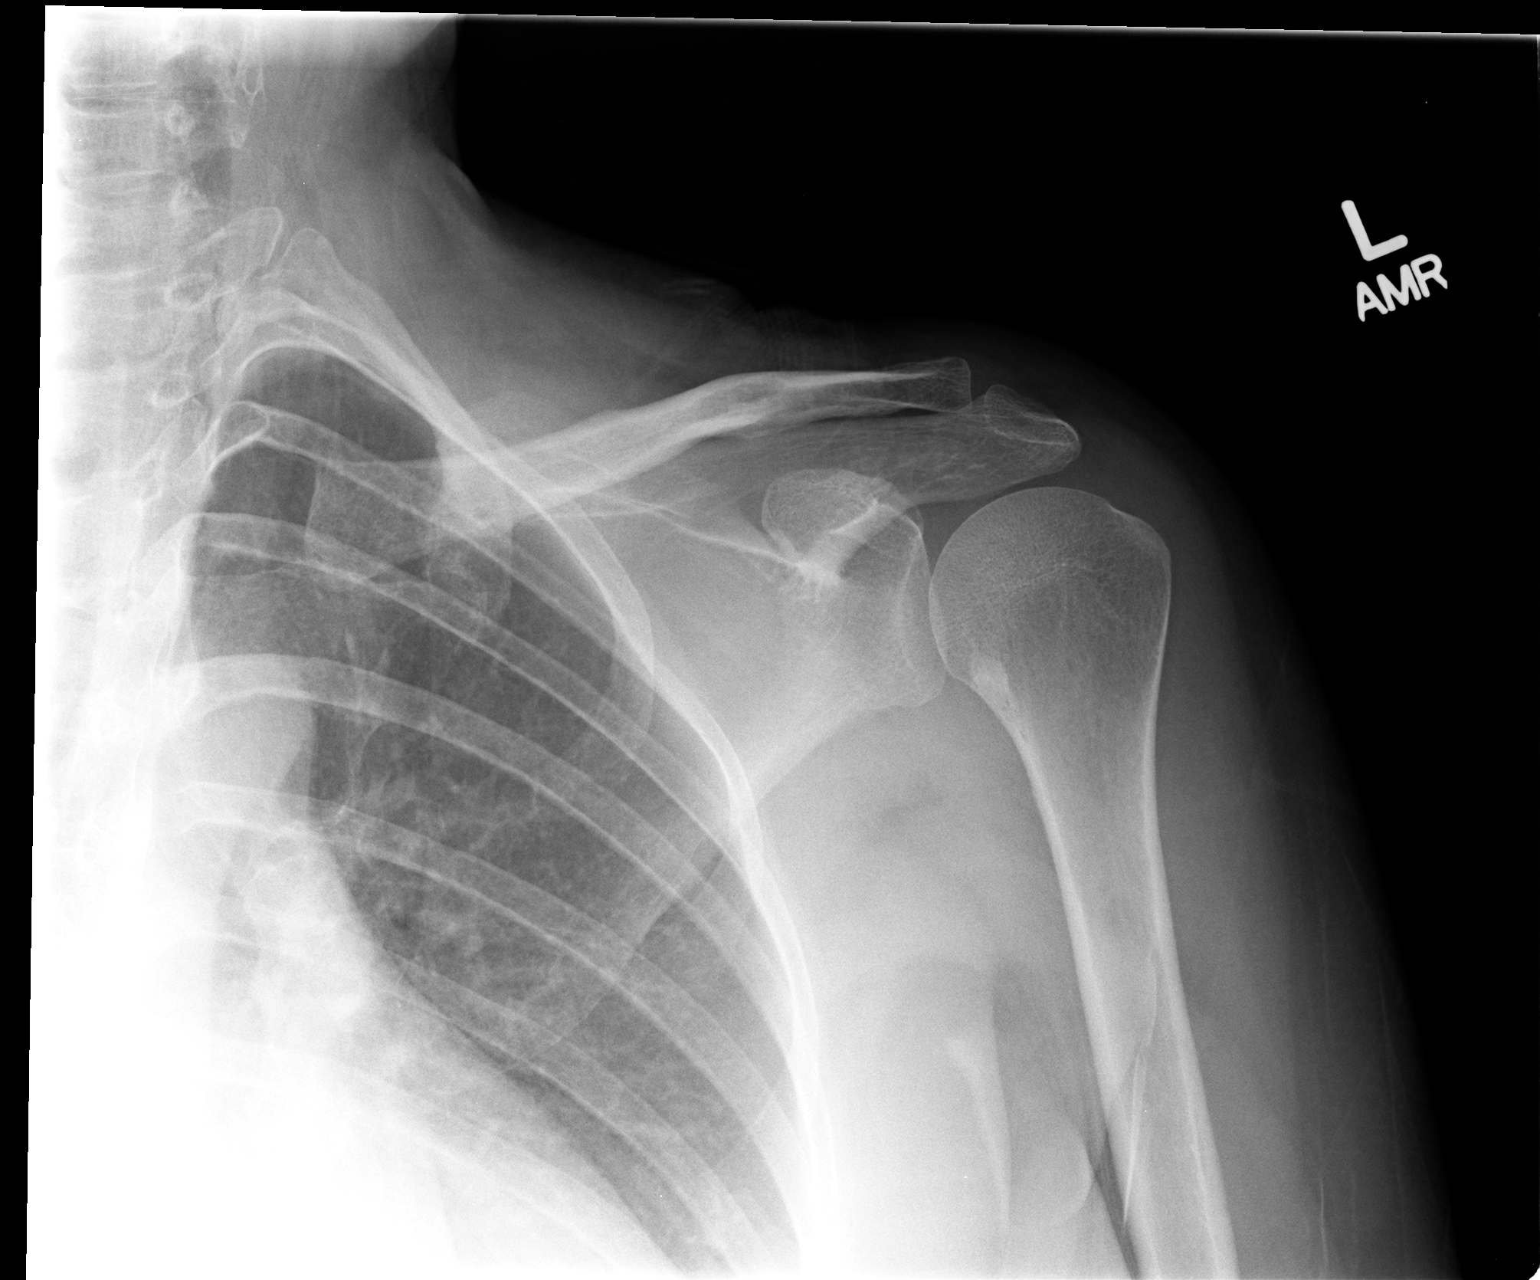

[view not recorded (2 of 3)]
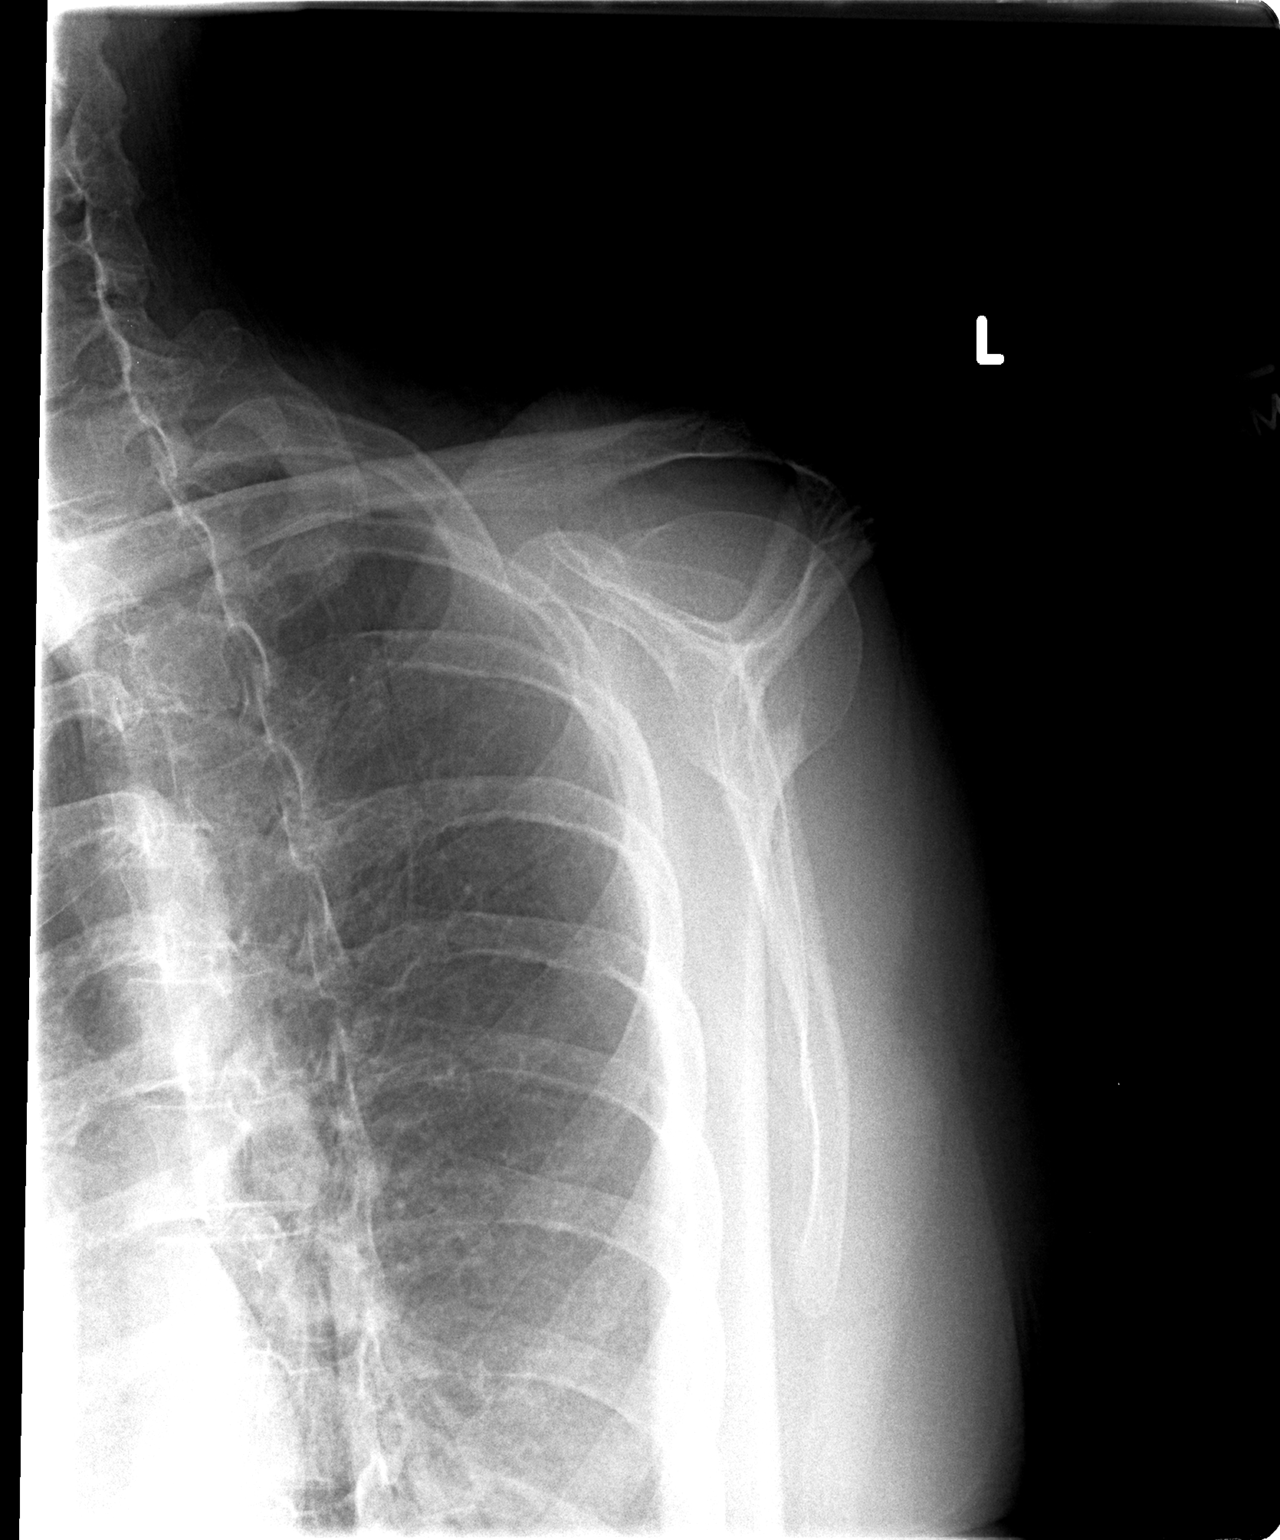

[view not recorded (3 of 3)]
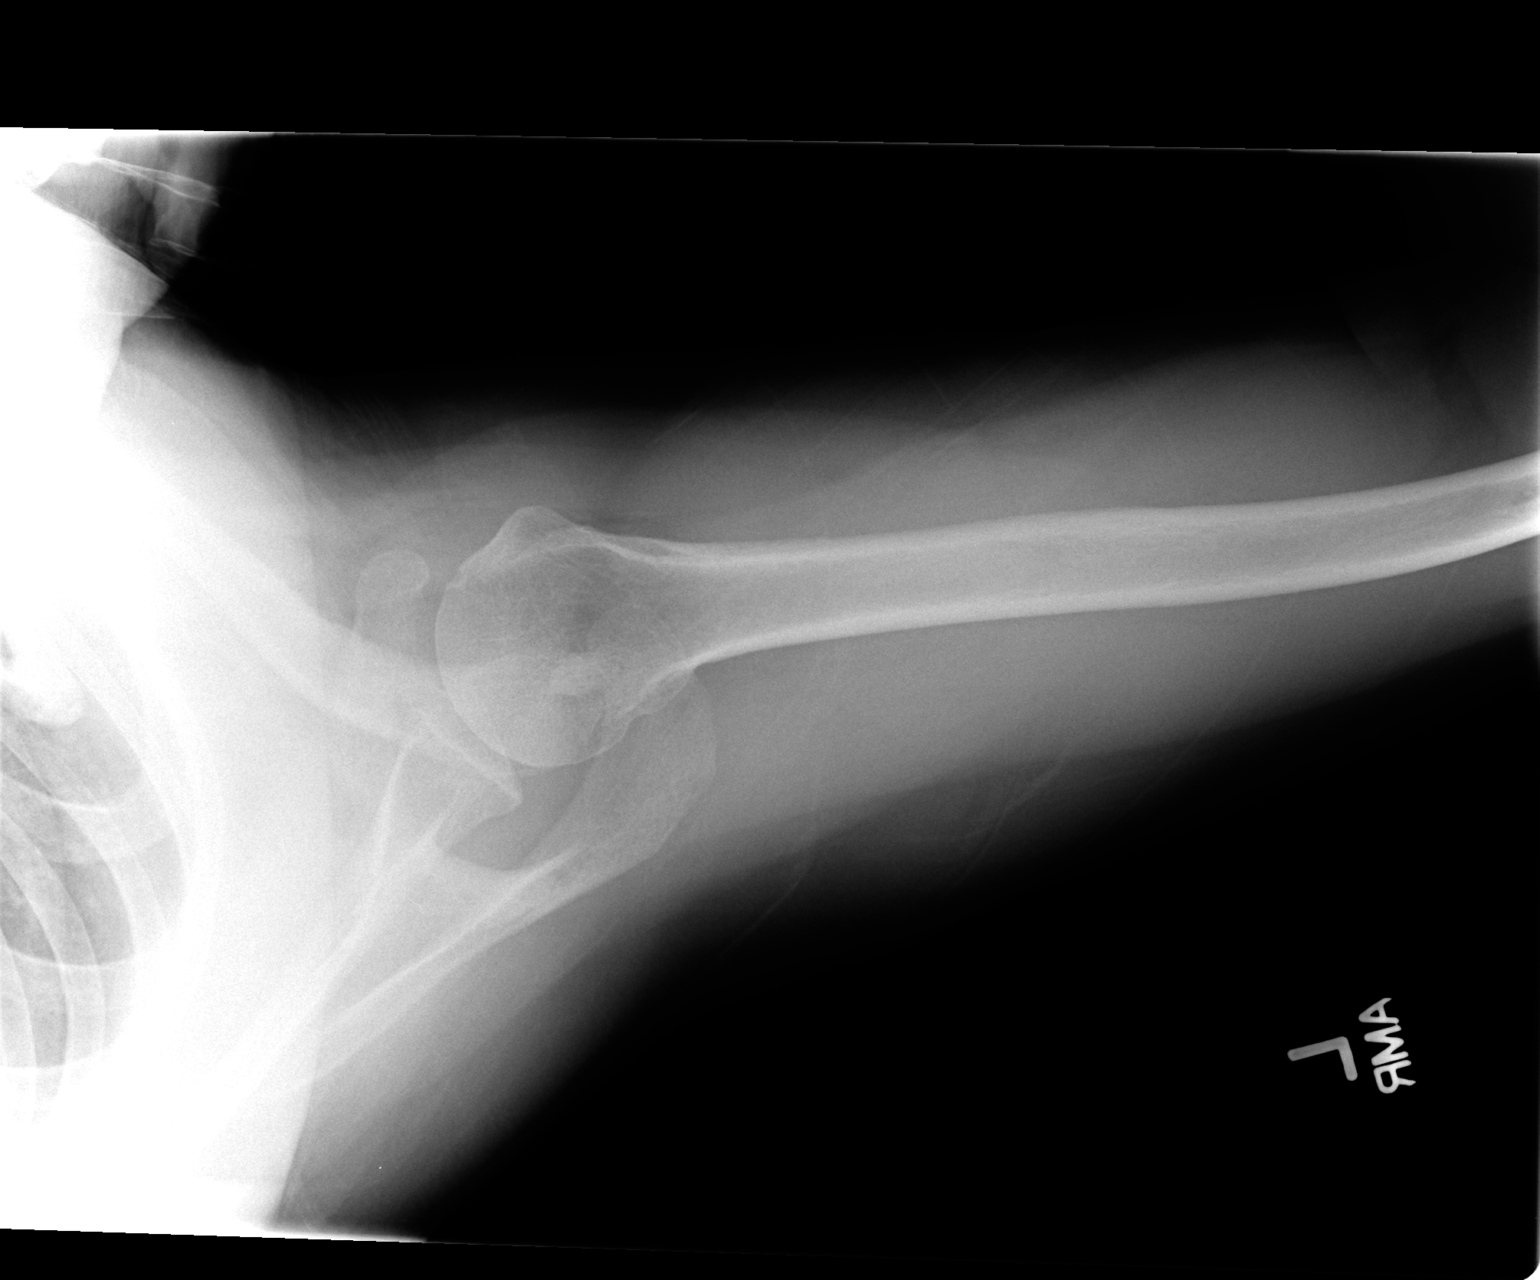

[3 of 3 positions shown; findings below may reference images not displayed]

FINDINGS: Mild osseous demineralization.

AC joint alignment normal.

No acute fracture, dislocation or bone destruction.

Visualized left ribs intact.

Bone island noted at proximal left humerus.
IMPRESSION: No acute osseous abnormalities.

## 2015-09-12 ENCOUNTER — Telehealth: Payer: 59 | Admitting: Nurse Practitioner

## 2015-09-12 DIAGNOSIS — J0191 Acute recurrent sinusitis, unspecified: Secondary | ICD-10-CM | POA: Diagnosis not present

## 2015-09-12 MED ORDER — AMOXICILLIN 875 MG PO TABS: 875.0000 mg | ORAL_TABLET | Freq: Two times a day (BID) | ORAL | Status: DC

## 2015-09-12 MED FILL — AMOXICILLIN 875 MG TABLET: 875 | 10 days supply | Qty: 20 | Fill #0

## 2015-09-12 NOTE — Progress Notes (Signed)
We are sorry that you are not feeling well.  Here is how we plan to help!  Based on what you have shared with me it looks like you have sinusitis.  Sinusitis is inflammation and infection in the sinus cavities of the head.  Based on your presentation I believe you most likely have Acute Bacterial Sinusitis.  This is an infection caused by bacteria and is treated with antibiotics. I have prescribed Amoxicillin 875mg  1 po BID. You may use an oral decongestant such as Mucinex D or if you have glaucoma or high blood pressure use plain Mucinex. Saline nasal spray help and can safely be used as often as needed for congestion.  If you develop worsening sinus pain, fever or notice severe headache and vision changes, or if symptoms are not better after completion of antibiotic, please schedule an appointment with a health care provider.    Sinus infections are not as easily transmitted as other respiratory infection, however we still recommend that you avoid close contact with loved ones, especially the very young and elderly.  Remember to wash your hands thoroughly throughout the day as this is the number one way to prevent the spread of infection!  Home Care:  Only take medications as instructed by your medical team.  Complete the entire course of an antibiotic.  Do not take these medications with alcohol.  A steam or ultrasonic humidifier can help congestion.  You can place a towel over your head and breathe in the steam from hot water coming from a faucet.  Avoid close contacts especially the very young and the elderly.  Cover your mouth when you cough or sneeze.  Always remember to wash your hands.  Get Help Right Away If:  You develop worsening fever or sinus pain.  You develop a severe head ache or visual changes.  Your symptoms persist after you have completed your treatment plan.  Make sure you  Understand these instructions.  Will watch your condition.  Will get help right away  if you are not doing well or get worse.  Your e-visit answers were reviewed by a board certified advanced clinical practitioner to complete your personal care plan.  Depending on the condition, your plan could have included both over the counter or prescription medications.  If there is a problem please reply  once you have received a response from your provider.  Your safety is important to us.  If you have drug allergies check your prescription carefully.    You can use MyChart to ask questions about today's visit, request a non-urgent call back, or ask for a work or school excuse for 24 hours related to this e-Visit. If it has been greater than 24 hours you will need to follow up with your provider, or enter a new e-Visit to address those concerns.  You will get an e-mail in the next two days asking about your experience.  I hope that your e-visit has been valuable and will speed your recovery. Thank you for using e-visits.

## 2015-11-30 ENCOUNTER — Ambulatory Visit (INDEPENDENT_AMBULATORY_CARE_PROVIDER_SITE_OTHER): Payer: BC Managed Care – PPO | Admitting: Physician Assistant

## 2015-11-30 ENCOUNTER — Encounter: Payer: Self-pay | Admitting: Physician Assistant

## 2015-11-30 VITALS — BP 122/70 | HR 75 | Temp 98.2°F | Resp 18 | Wt 148.0 lb

## 2015-11-30 DIAGNOSIS — J0191 Acute recurrent sinusitis, unspecified: Secondary | ICD-10-CM | POA: Diagnosis not present

## 2015-11-30 MED ORDER — AMOXICILLIN 875 MG PO TABS: 875.0000 mg | ORAL_TABLET | Freq: Two times a day (BID) | ORAL | 0 refills | Status: AC

## 2015-11-30 MED ORDER — PREDNISONE 20 MG PO TABS: ORAL_TABLET | ORAL | 0 refills | Status: AC

## 2015-11-30 NOTE — Progress Notes (Signed)
    Patient ID: [name] MRN: [id number], DOB: 05/12/1965, 50 y.o. Date of Encounter: 11/30/2015, 11:32 AM    Chief Complaint:  Chief Complaint  Patient presents with  . Sinus Problem    head ache     HPI: 50 y.o. year old white female says that she wasn't feeling good this past weekend and through this week. Morning started feeling much worse with sinus headache and even her teeth hurt. Says that she isn't blowing much out of her nose. Says that these are the same symptoms she usually has with her sinus infections. As it usually just gets stopped up and clocked up rather than blowing much now. Says that her throat feels a little sore secondary to drainage in the throat. Does not feel that she has any chest congestion. Has had no fevers or chills. Has been taking Allegra and DayQuil well without any relief.     Home Meds:   Outpatient Medications Prior to Visit  Medication Sig Dispense Refill  . ALPRAZolam (XANAX) 0.5 MG tablet Take 1 tablet (0.5 mg total) by mouth 2 (two) times daily as needed for anxiety. (Patient not taking: Reported on 11/30/2015) 45 tablet 1  . amoxicillin (AMOXIL) 875 MG tablet Take 1 tablet (875 mg total) by mouth 2 (two) times daily. 1 po BID (Patient not taking: Reported on 11/30/2015) 20 tablet 0   No facility-administered medications prior to visit.     Allergies: No Known Allergies    Review of Systems: See HPI for pertinent ROS. All other ROS negative.    Physical Exam: Blood pressure 122/70, pulse 75, temperature 98.2 F (36.8 C), temperature source Oral, resp. rate 18, weight 148 lb (67.1 kg)., Body mass index is 25.4 kg/m. General: WNWD WF.  Appears in no acute distress. HEENT: Normocephalic, atraumatic, eyes without discharge, sclera non-icteric, nares are without discharge. Bilateral auditory canals clear, TM's are without perforation, pearly grey and translucent with reflective cone of light bilaterally. Oral cavity moist, posterior  pharynx without exudate, erythema, peritonsillar abscess. She does have tenderness with percussion to frontal and maxillary sinuses bilaterally. Neck: Supple. No thyromegaly. No lymphadenopathy. Lungs: Clear bilaterally to auscultation without wheezes, rales, or rhonchi. Breathing is unlabored. Heart: Regular rhythm. No murmurs, rubs, or gallops. Msk:  Strength and tone normal for age. Extremities/Skin: Warm and dry.  Neuro: Alert and oriented X 3. Moves all extremities spontaneously. Gait is normal. CNII-XII grossly in tact. Psych:  Responds to questions appropriately with a normal affect.     ASSESSMENT AND PLAN:  50 y.o. year old female with  1. Acute recurrent sinusitis, unspecified location She is to take the amoxicillin and the prednisone taper as directed. Follow-up if symptoms do not resolve after completion of these. - amoxicillin (AMOXIL) 875 MG tablet; Take 1 tablet (875 mg total) by mouth 2 (two) times daily. 1 po BID  Dispense: 20 tablet; Refill: 0 - predniSONE (DELTASONE) 20 MG tablet; Take 3 daily for 2 days, then 2 daily for 2 days, then 1 daily for 2 days.  Dispense: 12 tablet; Refill: 0   Signed, [id number]Mary Beth Mcbride, GeorgiaPA, [facility] HospitalBS[facility] 11/30/2015 11:32 AM

## 2015-12-02 ENCOUNTER — Telehealth: Payer: Self-pay

## 2015-12-02 NOTE — Telephone Encounter (Signed)
Pt called in 12/02/15 stating she was having muscle aches all over body that started after taking rx amoxicillin, predniSONE. Pt concerned it's from medication, advise pt to discontinue medication if symptoms worsened or continues then go to urgent care over holiday weekend.

## 2015-12-06 NOTE — Telephone Encounter (Signed)
Contacted pt to check to see how she was feeling after complaining about muscle aches with Amoxicillin and Prednisone. Pt states she is having it taking care of elsewhere.

## 2015-12-06 NOTE — Telephone Encounter (Signed)
Did she stop the Amoxicillin? The Prednisone? Is she having symptoms at present?

## 2016-03-13 ENCOUNTER — Telehealth: Payer: 59 | Admitting: Family

## 2016-03-13 DIAGNOSIS — R197 Diarrhea, unspecified: Secondary | ICD-10-CM | POA: Diagnosis not present

## 2016-03-13 MED ORDER — ONDANSETRON HCL 4 MG PO TABS: 4.0000 mg | ORAL_TABLET | Freq: Three times a day (TID) | ORAL | 0 refills | Status: AC | PRN

## 2016-03-13 NOTE — Progress Notes (Signed)
We are sorry that you are not feeling well.  Here is how we plan to help!  Based on what you have shared with me it looks like you have Acute Infectious Diarrhea.  Most cases of acute diarrhea are due to infections with virus and bacteria and are self-limited conditions lasting less than 14 days.  For your symptoms you may take Imodium 2 mg tablets that are over the counter at your local pharmacy. Take two tablet now and then one after each loose stool up to 6 a day.  Antibiotics are not needed for most people with diarrhea.  Optional: Zofran 4 mg 1 tablet every 8 hours as needed for nausea and vomiting  I have sent your work note to return to work Wednesday as these are typically viral infections that improve significantly in the first 48-72 hours. If you are still having severe problems tomorrow (more than 5 stools and/or severely dark urine in spite of drinking more water) , this may be something more serious and you should consider being seen face-to-face for a full work-up of your symptoms.    HOME CARE  We recommend changing your diet to help with your symptoms for the next few days.  Drink plenty of fluids that contain water salt and sugar. Sports drinks such as Gatorade may help.   You may try broths, soups, bananas, applesauce, soft breads, mashed potatoes or crackers.   You are considered infectious for as long as the diarrhea continues. Hand washing or use of alcohol based hand sanitizers is recommend.  It is best to stay out of work or school until your symptoms stop.   GET HELP RIGHT AWAY  If you have dark yellow colored urine or do not pass urine frequently you should drink more fluids.    If your symptoms worsen   If you feel like you are going to pass out (faint)  You have a new problem  MAKE SURE YOU   Understand these instructions.  Will watch your condition.  Will get help right away if you are not doing well or get worse.  Your e-visit answers were  reviewed by a board certified advanced clinical practitioner to complete your personal care plan.  Depending on the condition, your plan could have included both over the counter or prescription medications.  If there is a problem please reply  once you have received a response from your provider.  Your safety is important to us.  If you have drug allergies check your prescription carefully.    You can use MyChart to ask questions about today's visit, request a non-urgent call back, or ask for a work or school excuse for 24 hours related to this e-Visit. If it has been greater than 24 hours you will need to follow up with your provider, or enter a new e-Visit to address those concerns.   You will get an e-mail in the next two days asking about your experience.  I hope that your e-visit has been valuable and will speed your recovery. Thank you for using e-visits.

## 2016-08-31 IMAGING — US US ABDOMEN LIMITED
1 series · 14 of 25 positions shown · non-contrast
Comparison: 01/09/2007

CLINICAL DATA: Right upper quadrant abdominal pain, evaluate
gallbladder

EXAM:
US ABDOMEN LIMITED - RIGHT UPPER QUADRANT

[Series 1: us abdomen limited · 0.17mm/px · 14 of 48 slices shown]
[im 1/48]
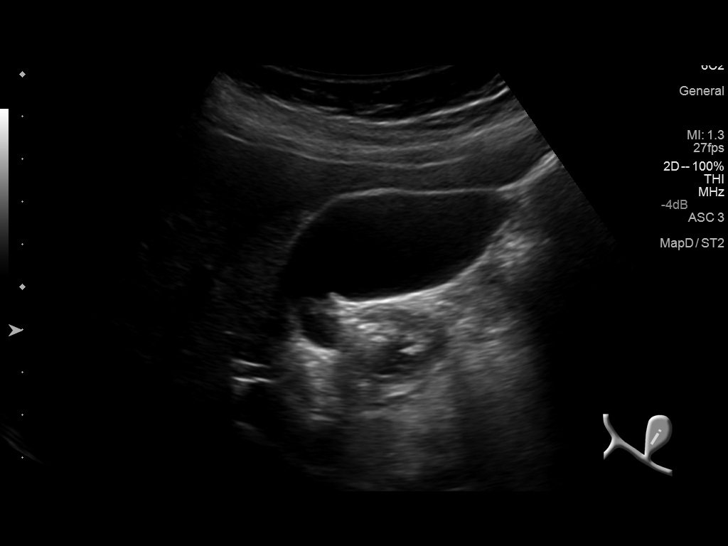
[im 4/48]
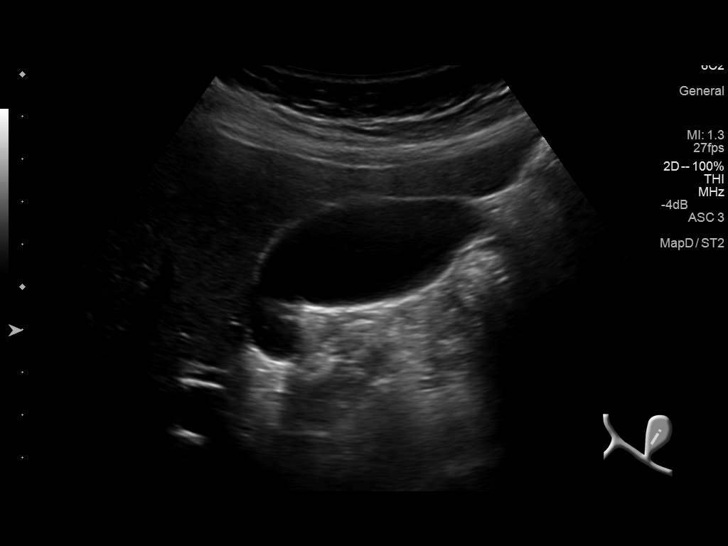
[im 8/48]
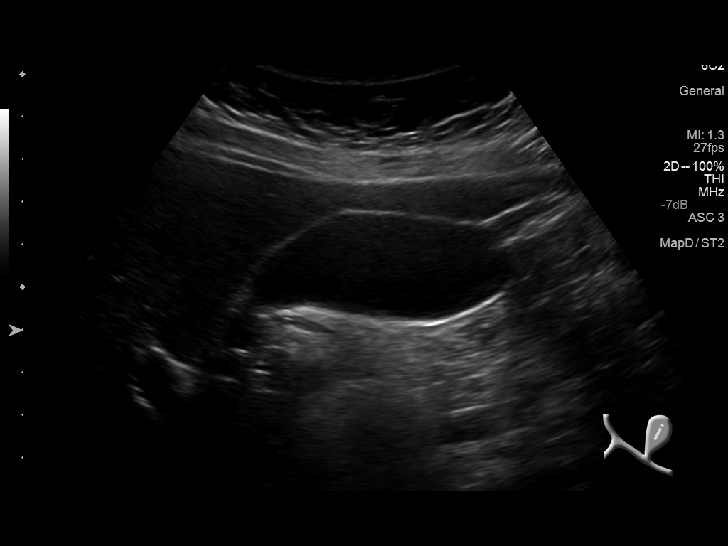
[im 12/48]
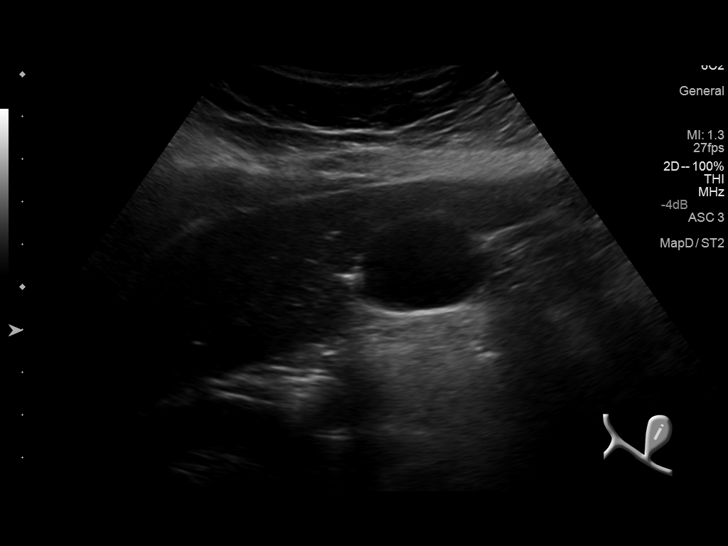
[im 16/48]
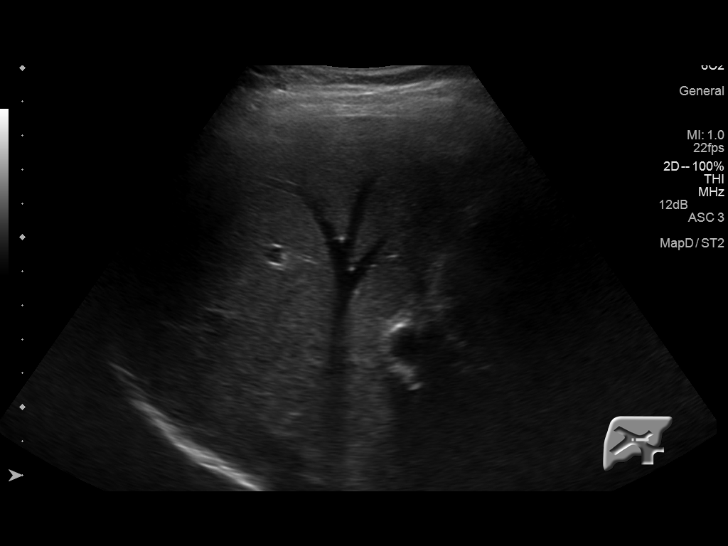
[im 18/48]
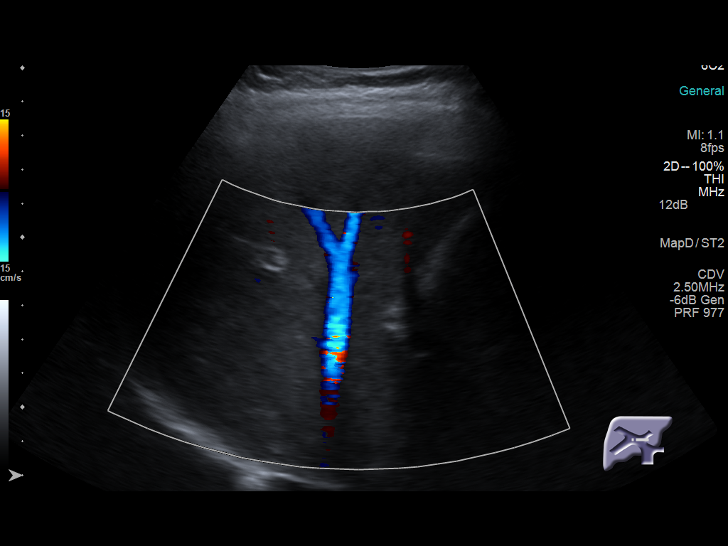
[im 22/48]
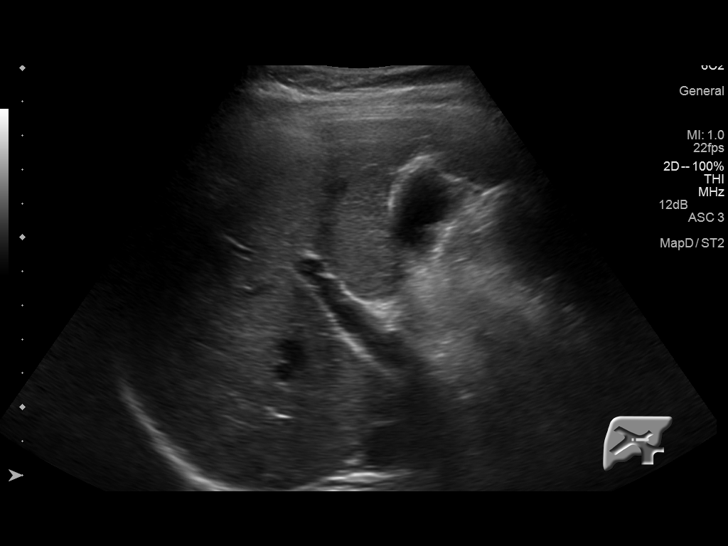
[im 26/48]
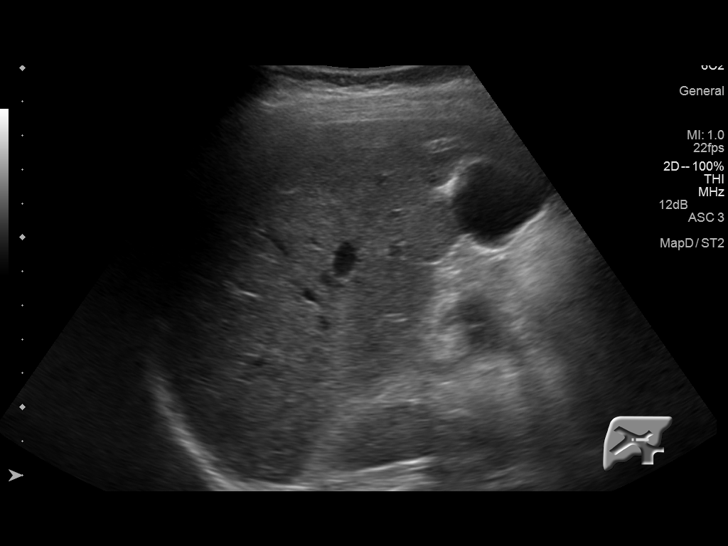
[im 30/48]
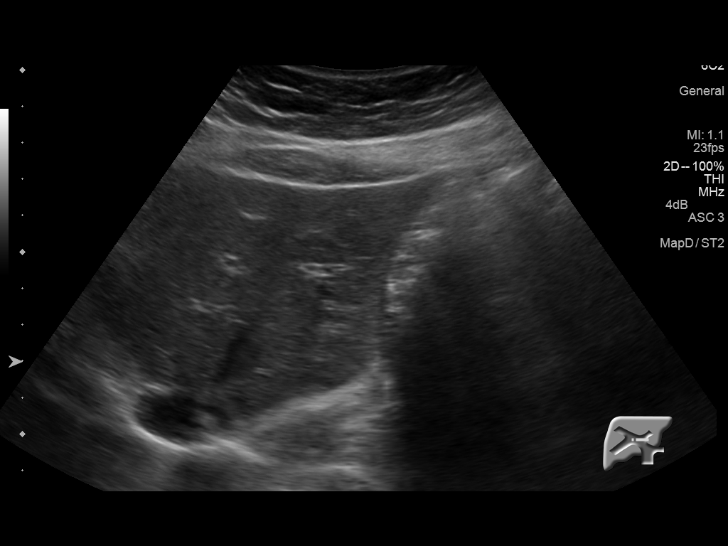
[im 32/48]
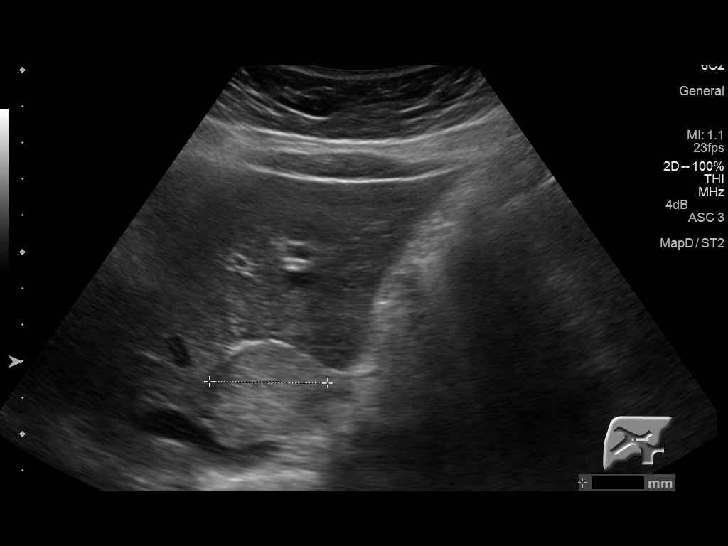
[im 36/48]
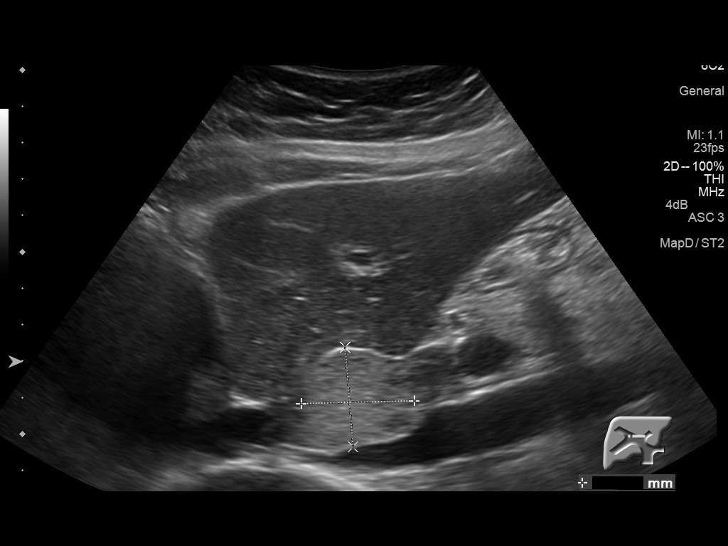
[im 40/48]
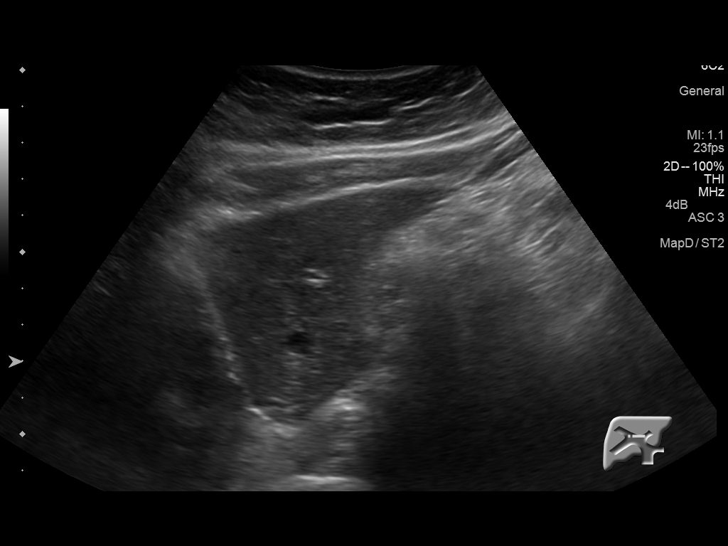
[im 44/48]
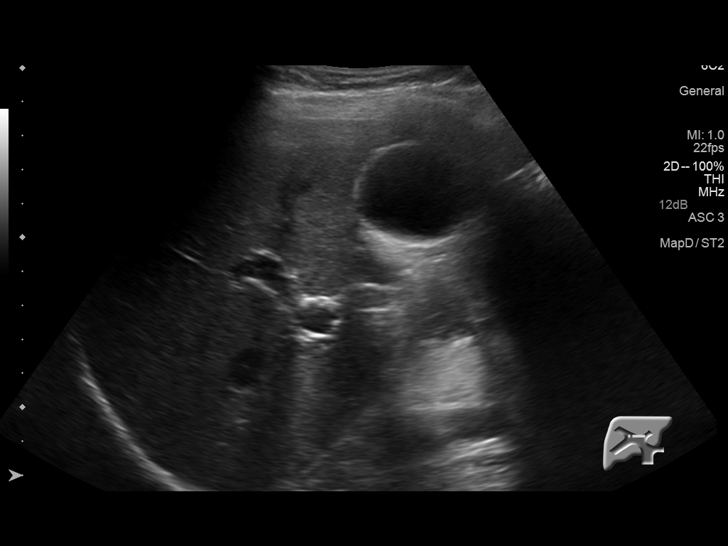
[im 48/48]
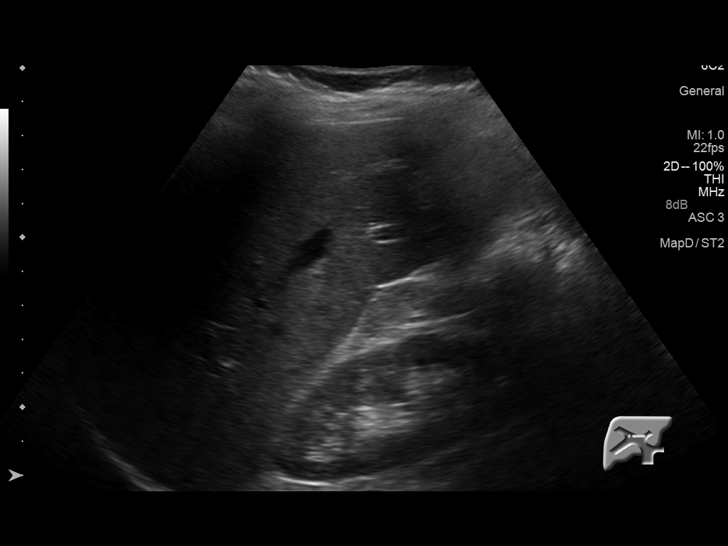

[14 of 25 positions shown; findings below may reference images not displayed]

FINDINGS: Gallbladder:

No gallstones or wall thickening visualized. No sonographic Murphy
sign noted.

Common bile duct:

Diameter: 2 mm

Liver:

3 cm hyperechoic circumscribed mass in the caudate lobe consistent
with hemangioma as described on prior study
IMPRESSION: No acute abnormalities.
